# Patient Record
Sex: Male | Born: 1995 | Race: Black or African American | Hispanic: No | Marital: Single | Smoking: Never smoker
Health system: Southern US, Community
[De-identification: ages and names within clinical notes are randomized; demographics above are authoritative.]

---

## 2020-10-26 ENCOUNTER — Encounter (HOSPITAL_COMMUNITY): Payer: Self-pay

## 2020-10-26 ENCOUNTER — Other Ambulatory Visit: Payer: Self-pay

## 2020-10-26 ENCOUNTER — Ambulatory Visit (HOSPITAL_COMMUNITY)
Admission: EM | Admit: 2020-10-26 | Discharge: 2020-10-26 | Disposition: A | Discharge status: Home / Self Care | Attending: Family Medicine | Admitting: Family Medicine

## 2020-10-26 DIAGNOSIS — J069 Acute upper respiratory infection, unspecified: Secondary | ICD-10-CM | POA: Diagnosis not present

## 2020-10-26 DIAGNOSIS — U071 COVID-19: Secondary | ICD-10-CM | POA: Diagnosis not present

## 2020-10-26 DIAGNOSIS — R509 Fever, unspecified: Secondary | ICD-10-CM | POA: Diagnosis present

## 2020-10-26 MED ORDER — PROMETHAZINE-DM 6.25-15 MG/5ML PO SYRP: 5.0000 mL | ORAL_SOLUTION | Freq: Four times a day (QID) | ORAL | 0 refills | Status: AC | PRN

## 2020-10-26 MED ORDER — ACETAMINOPHEN 325 MG PO TABS
650.0000 mg | ORAL_TABLET | Freq: Once | ORAL | Status: AC
  Administered 2020-10-26: 650 mg via ORAL

## 2020-10-26 MED ORDER — ACETAMINOPHEN 325 MG PO TABS
ORAL_TABLET | ORAL | Status: AC
  Filled 2020-10-26: qty 2

## 2020-10-26 MED ORDER — LIDOCAINE VISCOUS HCL 2 % MT SOLN: 20.0000 mL | OROMUCOSAL | 0 refills | Status: AC | PRN

## 2020-10-26 NOTE — ED Triage Notes (Signed)
Pt presents with fever, chills, sore throat, cough, body aches and nasal congestion x 5 days. NyQuil gives relief, last dose last night.

## 2020-10-26 NOTE — ED Provider Notes (Signed)
[facility]    CSN: [id number] Arrival date & time: 10/26/20  1736      History   Chief Complaint Chief Complaint  Patient presents with   Fever   Sore Throat   Generalized Body Aches   Nasal Congestion   Chills   Cough    HPI Alan Cunningham is a 24 y.o. male.   Patient presenting today with 5 day hx of fever, chills, cough, congestion, sore throat, body aches, headache. Denies N/V/D, abdominal pain, rashes, CP, SOB. Took nyquil several times which helped some. Has been around lots of people during homecoming week the past week, unsure if sick contacts. No known chronic medical conditions.      History reviewed. No pertinent past medical history.  There are no problems to display for this patient.   History reviewed. No pertinent surgical history.     Home Medications    Prior to Admission medications   Medication Sig Start Date End Date Taking? Authorizing Provider  lidocaine (XYLOCAINE) 2 % solution Use as directed 20 mLs in the mouth or throat as needed for mouth pain. 10/26/20   Particia Nearing, PA-C  promethazine-dextromethorphan (PROMETHAZINE-DM) 6.25-15 MG/5ML syrup Take 5 mLs by mouth 4 (four) times daily as needed for cough. 10/26/20   Particia Nearing, PA-C    Family History History reviewed. No pertinent family history.  Social History Social History   Tobacco Use   Smoking status: Never Smoker   Smokeless tobacco: Never Used  Substance Use Topics   Alcohol use: Yes   Drug use: Never     Allergies   Patient has no known allergies.   Review of Systems Review of Systems PER HPI   Physical Exam Triage Vital Signs ED Triage Vitals  Enc Vitals Group     BP 10/26/20 1811 105/61     Pulse Rate 10/26/20 1811 (!) 112     Resp 10/26/20 1811 18     Temp 10/26/20 1811 (!) 102.4 F (39.1 C)     Temp Source 10/26/20 1811 Oral     SpO2 --      Weight --      Height --      Head Circumference --      Peak  Flow --      Pain Score 10/26/20 1809 0     Pain Loc --      Pain Edu? --      Excl. in GC? --    No data found.  Updated Vital Signs BP 105/61 (BP Location: Right Arm)    Pulse (!) 112    Temp (!) 102.4 F (39.1 C) (Oral)    Resp 18   Visual Acuity Right Eye Distance:   Left Eye Distance:   Bilateral Distance:    Right Eye Near:   Left Eye Near:    Bilateral Near:     Physical Exam Vitals and nursing note reviewed.  Constitutional:      Appearance: Normal appearance.  HENT:     Head: Atraumatic.     Right Ear: Tympanic membrane normal.     Left Ear: Tympanic membrane normal.     Nose: Rhinorrhea present.     Mouth/Throat:     Mouth: Mucous membranes are moist.     Pharynx: Posterior oropharyngeal erythema (no significant tonsillar edema) present. No oropharyngeal exudate.  Eyes:     Extraocular Movements: Extraocular movements intact.     Conjunctiva/sclera: Conjunctivae normal.  Cardiovascular:  Rate and Rhythm: Regular rhythm. Tachycardia present.     Heart sounds: Normal heart sounds.  Pulmonary:     Effort: Pulmonary effort is normal.     Breath sounds: Normal breath sounds. No wheezing or rales.  Abdominal:     General: Bowel sounds are normal. There is no distension.     Palpations: Abdomen is soft.     Tenderness: There is no abdominal tenderness. There is no guarding.  Musculoskeletal:        General: Normal range of motion.     Cervical back: Normal range of motion and neck supple.  Skin:    General: Skin is warm and dry.  Neurological:     General: No focal deficit present.     Mental Status: He is oriented to person, place, and time.  Psychiatric:        Mood and Affect: Mood normal.        Thought Content: Thought content normal.        Judgment: Judgment normal.     UC Treatments / Results  Labs (all labs ordered are listed, but only abnormal results are displayed) Labs Reviewed  RESP PANEL BY RT PCR (RSV, FLU A&B, COVID)     EKG   Radiology No results found.  Procedures Procedures (including critical care time)  Medications Ordered in UC Medications  acetaminophen (TYLENOL) tablet 650 mg (650 mg Oral Given 10/26/20 1817)    Initial Impression / Assessment and Plan / UC Course  I have reviewed the triage vital signs and the nursing notes.  Pertinent labs & imaging results that were available during my care of the patient were reviewed by me and considered in my medical decision making (see chart for details).     Tylenol given in clinic due to fever of 102.4, suspect his tachycardia coming from this as well. Suspect viral URI, resp panel pending. Letter given while awaiting results, isolation protocol reviewed. Phenergan DM and viscous lidocaine sent for prn symptomatic relief, supportive home care and return precautions discussed at length.   Final Clinical Impressions(s) / UC Diagnoses   Final diagnoses:  Viral URI with cough   Discharge Instructions   None    ED Prescriptions    Medication Sig Dispense Auth. Provider   lidocaine (XYLOCAINE) 2 % solution Use as directed 20 mLs in the mouth or throat as needed for mouth pain. 100 mL Particia Nearing, [place]   promethazine-dextromethorphan (PROMETHAZINE-DM) 6.25-15 MG/5ML syrup Take 5 mLs by mouth 4 (four) times daily as needed for cough. 100 mL Particia Nearing, [place]     PDMP not reviewed this encounter.   Particia Nearing, [place] 10/26/20 1831

## 2020-10-27 LAB — RESP PANEL BY RT PCR (RSV, FLU A&B, COVID)
Influenza A by PCR: NEGATIVE
Influenza B by PCR: NEGATIVE
Respiratory Syncytial Virus by PCR: NEGATIVE
SARS Coronavirus 2 by RT PCR: POSITIVE — AB

## 2020-10-28 ENCOUNTER — Telehealth: Payer: Self-pay | Admitting: Nurse Practitioner

## 2020-10-28 ENCOUNTER — Telehealth: Payer: Self-pay | Admitting: Adult Health

## 2020-10-28 NOTE — Telephone Encounter (Signed)
Called to Discuss with patient about Covid symptoms and the use of the monoclonal antibody infusion for those with mild to moderate Covid symptoms and at a high risk of hospitalization.     Pt appears to qualify for this infusion due to a member of an at-risk group in accordance with the FDA Emergency Use Authorization.    Unable to reach pt. No My Chart. Voicemail message left after second attempt. First attempt someone answered when asked for patient and identified self they hung up.   Willette Alma, NP [facility]  [phone]

## 2020-10-28 NOTE — Telephone Encounter (Signed)
I called and talked with Alan Cunningham about COVID 19 and treatment with monoclonal antibody therapy.  He is going to think about it and will call us back if interested.    Lillard Anes, NP

## 2021-01-19 ENCOUNTER — Emergency Department (HOSPITAL_COMMUNITY)

## 2021-01-19 ENCOUNTER — Emergency Department (HOSPITAL_COMMUNITY)
Admission: EM | Admit: 2021-01-19 | Discharge: 2021-01-19 | Disposition: A | Discharge status: Home / Self Care | Attending: Emergency Medicine | Admitting: Emergency Medicine

## 2021-01-19 ENCOUNTER — Other Ambulatory Visit: Payer: Self-pay

## 2021-01-19 DIAGNOSIS — W500XXA Accidental hit or strike by another person, initial encounter: Secondary | ICD-10-CM | POA: Insufficient documentation

## 2021-01-19 DIAGNOSIS — R52 Pain, unspecified: Secondary | ICD-10-CM

## 2021-01-19 DIAGNOSIS — S43004A Unspecified dislocation of right shoulder joint, initial encounter: Secondary | ICD-10-CM | POA: Diagnosis not present

## 2021-01-19 DIAGNOSIS — S4991XA Unspecified injury of right shoulder and upper arm, initial encounter: Secondary | ICD-10-CM | POA: Diagnosis present

## 2021-01-19 MED ORDER — OXYCODONE-ACETAMINOPHEN 5-325 MG PO TABS
1.0000 | ORAL_TABLET | ORAL | Status: DC | PRN
  Administered 2021-01-19: 1 via ORAL
  Filled 2021-01-19: qty 1

## 2021-01-19 MED ORDER — PROPOFOL 10 MG/ML IV BOLUS
37.5000 mg | Freq: Once | INTRAVENOUS | Status: AC
  Administered 2021-01-19: 37.5 mg via INTRAVENOUS
  Filled 2021-01-19: qty 20

## 2021-01-19 MED ORDER — IBUPROFEN 800 MG PO TABS: 800.0000 mg | ORAL_TABLET | Freq: Three times a day (TID) | ORAL | 0 refills | Status: DC

## 2021-01-19 NOTE — ED Provider Notes (Signed)
.  Sedation  Date/Time: 01/19/2021 2:30 AM Performed by: Zadie Rhine, MD Authorized by: Zadie Rhine, MD   Consent:    Consent obtained:  Written   Consent given by:  Patient   Risks discussed:  Vomiting Universal protocol:    Immediately prior to procedure, a time out was called: yes     Patient identity confirmed:  Arm band and provided demographic data Indications:    Procedure performed:  Dislocation reduction   Procedure necessitating sedation performed by:  Physician performing sedation Pre-sedation assessment:    Time since last food or drink:  4   ASA classification: class 1 - normal, healthy patient     Mallampati score:  I - soft palate, uvula, fauces, pillars visible   Neck mobility: normal     Pre-sedation assessments completed and reviewed: airway patency and mental status     Pre-sedation assessment completed:  01/19/2021 2:31 AM Immediate pre-procedure details:    Reassessment: Patient reassessed immediately prior to procedure     Reviewed: vital signs     Verified: bag valve mask available, emergency equipment available, IV patency confirmed, oxygen available and suction available   Procedure details (see MAR for exact dosages):    Preoxygenation:  Nasal cannula   Sedation:  Propofol   Intended level of sedation: deep   Intra-procedure monitoring:  Blood pressure monitoring, cardiac monitor, continuous capnometry, continuous pulse oximetry and frequent vital sign checks   Intra-procedure events: none     Total Provider sedation time (minutes):  16 Post-procedure details:    Post-sedation assessment completed:  01/19/2021 3:26 AM   Attendance: Constant attendance by certified staff until patient recovered     Recovery: Patient returned to pre-procedure baseline     Post-sedation assessments completed and reviewed: mental status and respiratory function     Patient is stable for discharge or admission: yes     Procedure completion:  Tolerated well, no  immediate complications      Zadie Rhine, MD 01/19/21 [phone]

## 2021-01-19 NOTE — Discharge Instructions (Addendum)
You should continue to use the sling until you are evaluated by orthopedics.  Use ice and ibuprofen for pain.

## 2021-01-19 NOTE — ED Notes (Addendum)
Pt given fluids. No complaints of nausea.

## 2021-01-19 NOTE — Progress Notes (Signed)
Orthopedic Tech Progress Note Patient Details:  Alan Cunningham 04/30/1996 [id number]  Ortho Devices Type of Ortho Device: Sling immobilizer Ortho Device/Splint Location: rue. applied post reduction. Ortho Device/Splint Interventions: Ordered,Application,Adjustment   Post Interventions Patient Tolerated: Well Instructions Provided: Care of device,Adjustment of device   [name] 01/19/2021, 3:37 AM

## 2021-01-19 NOTE — ED Notes (Signed)
Consent at bedside.  
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
## 2021-01-22 ENCOUNTER — Ambulatory Visit (INDEPENDENT_AMBULATORY_CARE_PROVIDER_SITE_OTHER): Admitting: Physician Assistant

## 2021-01-22 ENCOUNTER — Ambulatory Visit: Admitting: Physician Assistant

## 2021-01-22 ENCOUNTER — Encounter: Payer: Self-pay | Admitting: Physician Assistant

## 2021-01-22 DIAGNOSIS — S43004S Unspecified dislocation of right shoulder joint, sequela: Secondary | ICD-10-CM
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
## 2021-02-17 ENCOUNTER — Ambulatory Visit: Admitting: Physician Assistant

## 2021-02-19 ENCOUNTER — Ambulatory Visit: Admitting: Physician Assistant

## 2021-02-24 ENCOUNTER — Ambulatory Visit: Admitting: Physician Assistant

## 2021-03-06 ENCOUNTER — Other Ambulatory Visit: Payer: Self-pay

## 2021-03-06 ENCOUNTER — Ambulatory Visit (INDEPENDENT_AMBULATORY_CARE_PROVIDER_SITE_OTHER): Admitting: Physician Assistant

## 2021-03-06 ENCOUNTER — Encounter: Payer: Self-pay | Admitting: Physician Assistant

## 2021-03-06 DIAGNOSIS — S43004S Unspecified dislocation of right shoulder joint, sequela: Secondary | ICD-10-CM

## 2021-03-06 NOTE — Progress Notes (Signed)
HPI: Alan Cunningham returns today for follow-up of his right shoulder dislocation.  He is now about 6 weeks status post injury.  He states he has had no additional dislocations of his shoulder.  About a week ago he started doing some push-ups.  He has some discomfort in the shoulder but no significant pain.  He would like to get back to working out with heavy weights.  Physical exam: Bilateral shoulders 5 out of 5 strengths against resistance.  He has negative empty can test bilaterally.  Apprehension test is negative on the right.  Passiv rotation of the right shoulder reveals some slight crepitus.  He has full active range of motion of the shoulder without pain today.   Impression: Status post right shoulder anterior dislocation 6 weeks  Plan: Recommend he go back to working out with just light weights.  Advised him to do more reps then had a weight for the next 6 weeks.  3 months status post injury and get back to full activities as tolerated.  If he has any additional dislocations he will follow-up with [place] or any questions or concerns.  Questions were encouraged and answered at length.

## 2021-05-08 ENCOUNTER — Encounter (HOSPITAL_COMMUNITY): Payer: Self-pay

## 2021-05-08 ENCOUNTER — Ambulatory Visit (HOSPITAL_COMMUNITY)
Admission: EM | Admit: 2021-05-08 | Discharge: 2021-05-08 | Disposition: A | Discharge status: Home / Self Care | Attending: Emergency Medicine | Admitting: Emergency Medicine

## 2021-05-08 DIAGNOSIS — R300 Dysuria: Secondary | ICD-10-CM | POA: Insufficient documentation

## 2021-05-08 DIAGNOSIS — R369 Urethral discharge, unspecified: Secondary | ICD-10-CM | POA: Diagnosis present

## 2021-05-08 LAB — POCT URINALYSIS DIPSTICK, ED / UC
Bilirubin Urine: NEGATIVE
Glucose, UA: NEGATIVE mg/dL
Hgb urine dipstick: NEGATIVE
Leukocytes,Ua: NEGATIVE
Nitrite: NEGATIVE
Protein, ur: 100 mg/dL — AB
Specific Gravity, Urine: 1.025 (ref 1.005–1.030)
Urobilinogen, UA: 0.2 mg/dL (ref 0.0–1.0)
pH: 7 (ref 5.0–8.0)

## 2021-05-08 NOTE — ED Provider Notes (Signed)
[facility]    CSN: [id number] Arrival date & time: 05/08/21  1723      History   Chief Complaint Chief Complaint  Patient presents with  . Penile Discharge  . pain during urination    HPI Alan Cunningham is a 25 y.o. male.   Patient here for evaluation of possible UTI.  Reports having some dysuria and frequency as well as a slight discharge.  Denies any concern over STIs as he reports that he is monogamous and girlfriend recently tested negative.  Denies any trauma, injury, or other precipitating event.  Denies any specific alleviating or aggravating factors.  Denies any fevers, chest pain, shortness of breath, N/V/D, numbness, tingling, weakness, abdominal pain, or headaches.     The history is provided by the patient.  Penile Discharge    History reviewed. No pertinent past medical history.  There are no problems to display for this patient.   History reviewed. No pertinent surgical history.     Home Medications    Prior to Admission medications   Medication Sig Start Date End Date Taking? Authorizing Provider  ibuprofen (ADVIL) 800 MG tablet Take 1 tablet (800 mg total) by mouth 3 (three) times daily. 01/19/21   Roxy Horseman, PA-C  lidocaine (XYLOCAINE) 2 % solution Use as directed 20 mLs in the mouth or throat as needed for mouth pain. 10/26/20   Particia Nearing, PA-C  promethazine-dextromethorphan (PROMETHAZINE-DM) 6.25-15 MG/5ML syrup Take 5 mLs by mouth 4 (four) times daily as needed for cough. 10/26/20   Particia Nearing, PA-C    Family History History reviewed. No pertinent family history.  Social History Social History   Tobacco Use  . Smoking status: Never Smoker  . Smokeless tobacco: Never Used  Substance Use Topics  . Alcohol use: Yes  . Drug use: Never     Allergies   Patient has no known allergies.   Review of Systems Review of Systems  Genitourinary: Positive for dysuria, frequency and penile discharge.  All  other systems reviewed and are negative.    Physical Exam Triage Vital Signs ED Triage Vitals  Enc Vitals Group     BP 05/08/21 1856 106/65     Pulse Rate 05/08/21 1856 75     Resp 05/08/21 1856 18     Temp 05/08/21 1856 98.9 F (37.2 C)     Temp Source 05/08/21 1856 Oral     SpO2 05/08/21 1856 98 %     Weight --      Height --      Head Circumference --      Peak Flow --      Pain Score 05/08/21 1855 0     Pain Loc --      Pain Edu? --      Excl. in GC? --    No data found.  Updated Vital Signs BP 106/65 (BP Location: Right Arm)   Pulse 75   Temp 98.9 F (37.2 C) (Oral)   Resp 18   SpO2 98%   Visual Acuity Right Eye Distance:   Left Eye Distance:   Bilateral Distance:    Right Eye Near:   Left Eye Near:    Bilateral Near:     Physical Exam Vitals and nursing note reviewed.  Constitutional:      General: He is not in acute distress.    Appearance: Normal appearance. He is not ill-appearing, toxic-appearing or diaphoretic.  HENT:     Head: Normocephalic and  atraumatic.  Eyes:     Conjunctiva/sclera: Conjunctivae normal.  Cardiovascular:     Rate and Rhythm: Normal rate.     Pulses: Normal pulses.  Pulmonary:     Effort: Pulmonary effort is normal.  Abdominal:     General: Abdomen is flat.  Genitourinary:    Comments: declines Musculoskeletal:        General: Normal range of motion.     Cervical back: Normal range of motion.  Skin:    General: Skin is warm and dry.  Neurological:     General: No focal deficit present.     Mental Status: He is alert and oriented to person, place, and time.  Psychiatric:        Mood and Affect: Mood normal.      UC Treatments / Results  Labs (all labs ordered are listed, but only abnormal results are displayed) Labs Reviewed  POCT URINALYSIS DIPSTICK, ED / UC - Abnormal; Notable for the following components:      Result Value   Ketones, ur TRACE (*)    Protein, ur 100 (*)    All other components within  normal limits  URINE CULTURE    EKG   Radiology No results found.  Procedures Procedures (including critical care time)  Medications Ordered in UC Medications - No data to display  Initial Impression / Assessment and Plan / UC Course  I have reviewed the triage vital signs and the nursing notes.  Pertinent labs & imaging results that were available during my care of the patient were reviewed by me and considered in my medical decision making (see chart for details).     Assessment negative for red flags or concerns.  Urinalysis positive for ketones and protein but otherwise no signs of infection.  Urine culture pending.  Patient refused STI screening.  Encourage fluids and rest.  Patient may take AZO cranberry or cranberry juice for symptom management.  Encourage patient to follow-up for any worsening symptoms or lack of symptom resolution. Final Clinical Impressions(s) / UC Diagnoses   Final diagnoses:  Dysuria  Penile discharge     Discharge Instructions     We will contact you if the results of your urine culture are positive.   Drink plenty of fluids, especially water.  You can take AZO cranberry or try cranberry juice for symptom management.    If your symptoms do not resolve, return for re-evaluation.     ED Prescriptions    None     PDMP not reviewed this encounter.   Ivette Loyal, NP 05/08/21 2100

## 2021-05-08 NOTE — Discharge Instructions (Signed)
We will contact you if the results of your urine culture are positive.   Drink plenty of fluids, especially water.  You can take AZO cranberry or try cranberry juice for symptom management.    If your symptoms do not resolve, return for re-evaluation.

## 2021-05-08 NOTE — ED Triage Notes (Signed)
Pt in with c/o pain during urination and penile discharge x 2 weeks

## 2021-05-10 LAB — URINE CULTURE: Culture: NO GROWTH

## 2022-03-25 IMAGING — CR DG SHOULDER 2+V*R*
2 series · 2 of 2 positions shown · non-contrast
Comparison: None.

CLINICAL DATA: Shoulder dislocation

EXAM:
RIGHT SHOULDER - 2+ VIEW

[shoulder y view]
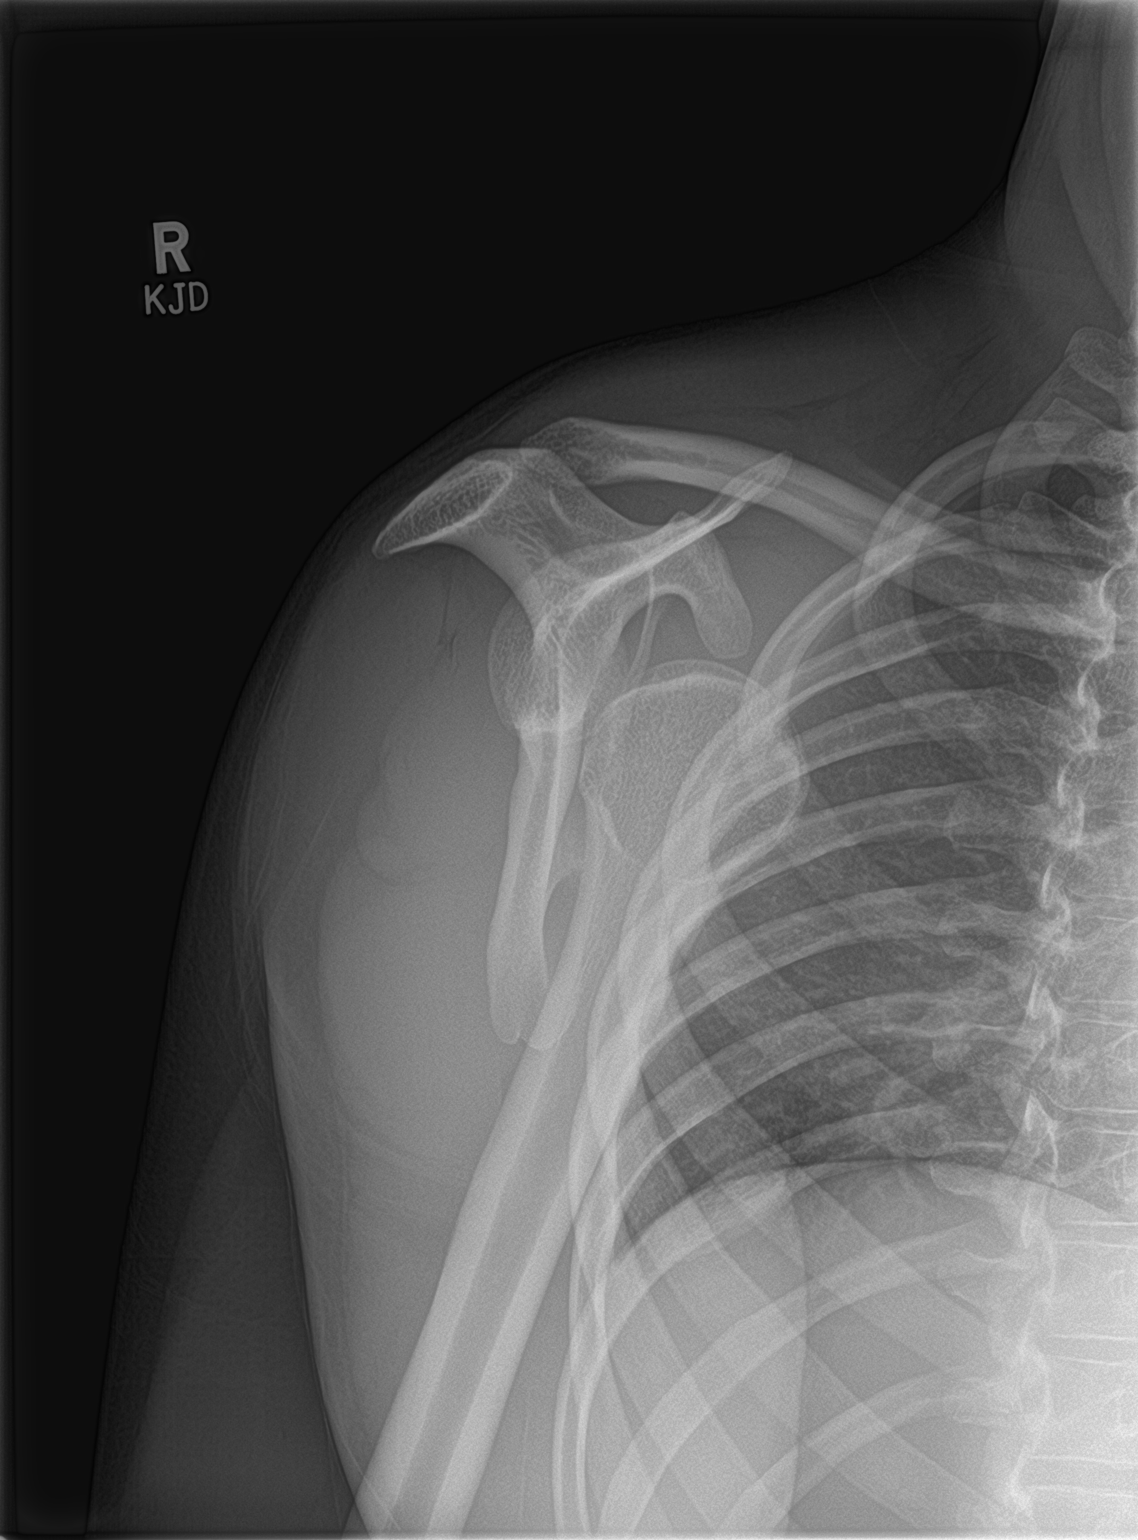

[shoulder ap neutral]
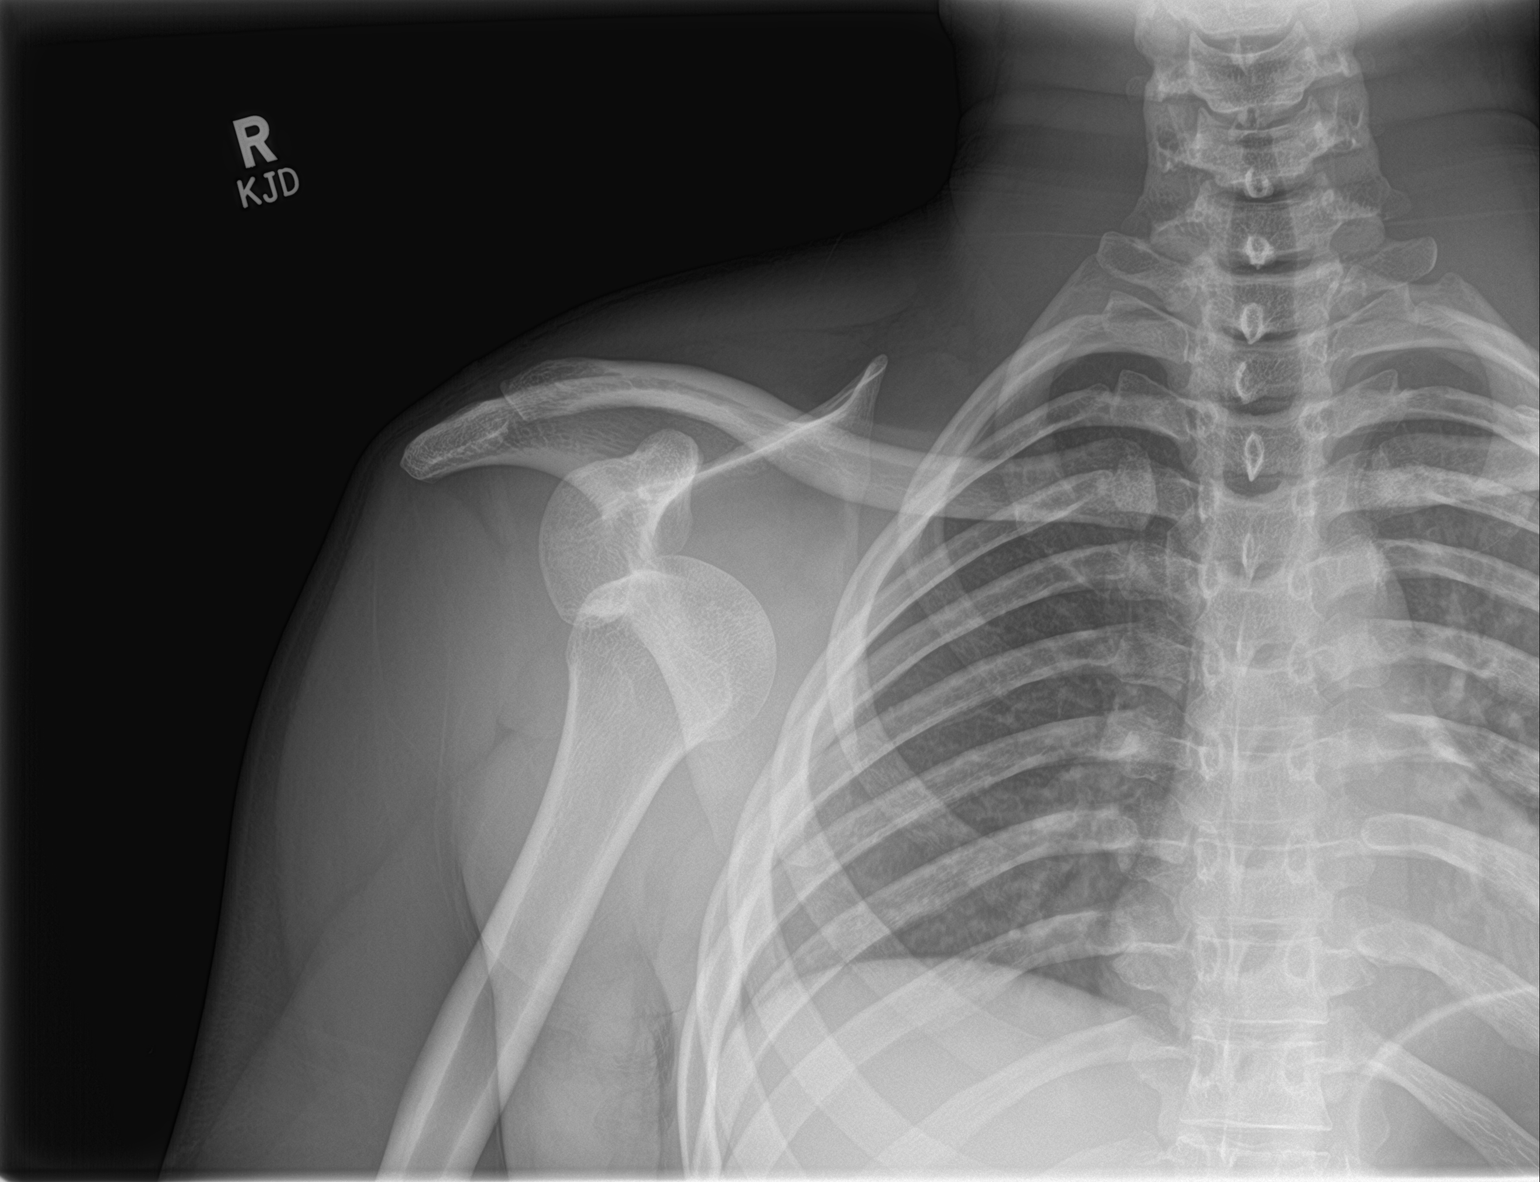

[2 of 2 positions shown; findings below may reference images not displayed]

FINDINGS: Anterior inferior dislocation of the right humeral head is noted. No
acute fracture is seen. Underlying bony thorax is within normal
limits.
IMPRESSION: Anterior inferior dislocation of the right humeral head

## 2023-11-21 ENCOUNTER — Encounter (HOSPITAL_COMMUNITY): Payer: Self-pay

## 2023-11-21 ENCOUNTER — Ambulatory Visit (HOSPITAL_COMMUNITY): Admission: EM | Admit: 2023-11-21 | Discharge: 2023-11-21 | Disposition: A | Discharge status: Home / Self Care

## 2023-11-21 ENCOUNTER — Other Ambulatory Visit: Payer: Self-pay

## 2023-11-21 ENCOUNTER — Encounter (HOSPITAL_COMMUNITY): Payer: Self-pay | Admitting: *Deleted

## 2023-11-21 ENCOUNTER — Emergency Department (HOSPITAL_COMMUNITY): Payer: BC Managed Care – PPO

## 2023-11-21 ENCOUNTER — Emergency Department (HOSPITAL_COMMUNITY)
Admission: EM | Admit: 2023-11-21 | Discharge: 2023-11-21 | Disposition: A | Discharge status: Home / Self Care | Payer: BC Managed Care – PPO

## 2023-11-21 DIAGNOSIS — S43014A Anterior dislocation of right humerus, initial encounter: Secondary | ICD-10-CM | POA: Insufficient documentation

## 2023-11-21 DIAGNOSIS — X500XXA Overexertion from strenuous movement or load, initial encounter: Secondary | ICD-10-CM | POA: Insufficient documentation

## 2023-11-21 DIAGNOSIS — Y990 Civilian activity done for income or pay: Secondary | ICD-10-CM | POA: Insufficient documentation

## 2023-11-21 DIAGNOSIS — S4991XA Unspecified injury of right shoulder and upper arm, initial encounter: Secondary | ICD-10-CM | POA: Diagnosis present

## 2023-11-21 DIAGNOSIS — S43004A Unspecified dislocation of right shoulder joint, initial encounter: Secondary | ICD-10-CM

## 2023-11-21 MED ORDER — PROPOFOL 10 MG/ML IV BOLUS
0.5000 mg/kg | Freq: Once | INTRAVENOUS | Status: DC
  Filled 2023-11-21: qty 20

## 2023-11-21 MED ORDER — ONDANSETRON HCL 4 MG/2ML IJ SOLN
4.0000 mg | Freq: Once | INTRAMUSCULAR | Status: AC
  Administered 2023-11-21: 4 mg via INTRAVENOUS
  Filled 2023-11-21: qty 2

## 2023-11-21 MED ORDER — SODIUM CHLORIDE 0.9 % IV BOLUS
1000.0000 mL | Freq: Once | INTRAVENOUS | Status: AC
  Administered 2023-11-21: 1000 mL via INTRAVENOUS

## 2023-11-21 MED ORDER — KETOROLAC TROMETHAMINE 10 MG PO TABS: 10.0000 mg | ORAL_TABLET | Freq: Four times a day (QID) | ORAL | 0 refills | Status: AC | PRN

## 2023-11-21 MED ORDER — KETOROLAC TROMETHAMINE 15 MG/ML IJ SOLN
15.0000 mg | Freq: Once | INTRAMUSCULAR | Status: AC
  Administered 2023-11-21: 15 mg via INTRAVENOUS
  Filled 2023-11-21: qty 1

## 2023-11-21 MED ORDER — PROPOFOL 10 MG/ML IV BOLUS
INTRAVENOUS | Status: AC | PRN
  Administered 2023-11-21: 3.8 mg via INTRAVENOUS
  Administered 2023-11-21: 20 mg via INTRAVENOUS

## 2023-11-21 NOTE — Discharge Instructions (Addendum)
Please keep your arm in the shoulder sling.  Take the ketorolac as needed for pain.  Follow-up with the orthopedic office at the number provided.  Return to the ER for worsening symptoms.

## 2023-11-21 NOTE — ED Triage Notes (Signed)
The pt has a dislocated rt shoulder he did it  2 hours ago at work  his shoulder struck  an object and the pt popped out of place  the shoulder has been out one other time

## 2023-11-21 NOTE — ED Provider Notes (Signed)
Patient seen briefly in triage.  Today he was pushing on something and his right shoulder popped out of place.  He has had it dislocate before  He cannot abduct his right upper arm and he is in a lot of pain.  I have asked him to proceed to the emergency room for further evaluation and treatment that we cannot provide here in urgent care setting   Zenia Resides, MD 11/21/23 1737

## 2023-11-21 NOTE — ED Triage Notes (Signed)
Presents with right shoulder pain after pulling heavy equipment today at work. Pain 10/10

## 2023-11-21 NOTE — ED Provider Notes (Signed)
[place] EMERGENCY DEPARTMENT AT [facility] Provider Note   CSN: [id number] Arrival date & time: 11/21/23  1744     History  Chief Complaint  Patient presents with   Shoulder Injury    Alan Cunningham is a 27 y.o. male.  27 year old male with past medical history of shoulder dislocation in the past and no other medical issues presenting to the emergency department today with pain in his right shoulder.  The patient states that he was pushing some heavy equipment at work and he felt his right shoulder pop.  This occurred 3 hours prior to arrival.  He denies any other injuries.  He denies any focal weakness, numbness, or tingling.  States he is having pain in the right shoulder.  This is similar to what he experienced when his shoulder dislocated few years ago.  He denies any issues with anesthesia in the past.   Shoulder Injury       Home Medications Prior to Admission medications   Medication Sig Start Date End Date Taking? Authorizing Provider  ketorolac (TORADOL) 10 MG tablet Take 1 tablet (10 mg total) by mouth every 6 (six) hours as needed. 11/21/23  Yes Durwin Glaze, MD  lidocaine (XYLOCAINE) 2 % solution Use as directed 20 mLs in the mouth or throat as needed for mouth pain. 10/26/20   Particia Nearing, PA-C  promethazine-dextromethorphan (PROMETHAZINE-DM) 6.25-15 MG/5ML syrup Take 5 mLs by mouth 4 (four) times daily as needed for cough. 10/26/20   Particia Nearing, PA-C      Allergies    Patient has no known allergies.    Review of Systems   Review of Systems  Musculoskeletal:        Right shoulder pain  All other systems reviewed and are negative.   Physical Exam Updated Vital Signs BP 99/62   Pulse (!) 55   Temp 98.6 F (37 C) (Oral)   Resp 16   Ht 5\' 8"  (1.727 m)   Wt 77.1 kg   SpO2 97%   BMI 25.85 kg/m  Physical Exam Vitals and nursing note reviewed.   Gen: Appears uncomfortable Eyes: PERRL, EOMI HEENT: no oropharyngeal  swelling Neck: trachea midline Resp: clear to auscultation bilaterally Card: RRR, no murmurs, rubs, or gallops Abd: nontender, nondistended Extremities: Obvious deformity to right shoulder noted Neuro: Patient has normal grip strength as well as normal strength with wrist flexion extension as well as elbow flexion and extension, he will not move the right shoulder due to pain.  Reports normal sensation over the deltoid. Vascular: 2+ radial pulses bilaterally Skin: no rashes Psyc: acting appropriately   ED Results / Procedures / Treatments   Labs (all labs ordered are listed, but only abnormal results are displayed) Labs Reviewed - No data to display  EKG None  Radiology DG Shoulder Right  Result Date: 11/21/2023 CLINICAL DATA:  Status post reduction EXAM: RIGHT SHOULDER - 3 VIEW COMPARISON:  Same day right shoulder radiographs FINDINGS: Interval reduction of right shoulder dislocation. Anatomic alignment of the right shoulder. Hill-Sachs deformity of the lateral humeral head. IMPRESSION: Interval reduction of right shoulder dislocation. Electronically Signed   By: Agustin Cree M.D.   On: 11/21/2023 19:52   DG Shoulder Right  Result Date: 11/21/2023 CLINICAL DATA:  Dislocation of right shoulder. EXAM: RIGHT SHOULDER - 2+ VIEW COMPARISON:  January 19, 2021. FINDINGS: Anterior dislocation of right glenohumeral joint is noted. No definite fracture is noted. IMPRESSION: Anterior dislocation of right glenohumeral joint  is noted. Electronically Signed   By: Lupita Raider M.D.   On: 11/21/2023 18:59    Procedures .Sedation  Date/Time: 11/21/2023 7:59 PM  Performed by: Durwin Glaze, MD Authorized by: Durwin Glaze, MD   Consent:    Consent obtained:  Written   Consent given by:  Patient   Risks discussed:  Allergic reaction, prolonged hypoxia resulting in organ damage, dysrhythmia, prolonged sedation necessitating reversal, inadequate sedation, respiratory compromise necessitating  ventilatory assistance and intubation, nausea and vomiting   Alternatives discussed:  Analgesia without sedation and anxiolysis Universal protocol:    Procedure explained and questions answered to patient or proxy's satisfaction: yes     Relevant documents present and verified: yes     Test results available: yes     Imaging studies available: yes     Required blood products, implants, devices, and special equipment available: yes     Site/side marked: yes     Immediately prior to procedure, a time out was called: yes   Indications:    Procedure performed:  Dislocation reduction   Procedure necessitating sedation performed by:  Physician performing sedation Pre-sedation assessment:    Time since last food or drink:  1600   NPO status caution: urgency dictates proceeding with non-ideal NPO status     ASA classification: class 1 - normal, healthy patient     Mouth opening:  3 or more finger widths   Thyromental distance:  4 finger widths   Mallampati score:  I - soft palate, uvula, fauces, pillars visible   Neck mobility: normal     Pre-sedation assessments completed and reviewed: pre-procedure airway patency not reviewed, pre-procedure cardiovascular function not reviewed, pre-procedure hydration status not reviewed, pre-procedure mental status not reviewed, pre-procedure nausea and vomiting status not reviewed, pre-procedure pain level not reviewed, pre-procedure respiratory function not reviewed and pre-procedure temperature not reviewed   A pre-sedation assessment was completed prior to the start of the procedure Immediate pre-procedure details:    Reassessment: Patient reassessed immediately prior to procedure     Reviewed: vital signs, relevant labs/tests and NPO status     Verified: bag valve mask available, emergency equipment available, intubation equipment available, IV patency confirmed, oxygen available and suction available   Procedure details (see MAR for exact dosages):     Preoxygenation:  Nasal cannula   Sedation:  Propofol   Intended level of sedation: moderate (conscious sedation)   Analgesia: Toradol.   Intra-procedure monitoring:  Blood pressure monitoring, continuous capnometry, frequent LOC assessments, frequent vital sign checks, continuous pulse oximetry and cardiac monitor   Intra-procedure events: hypotension     Intra-procedure management:  Fluid bolus   Total Provider sedation time (minutes):  12 Post-procedure details:   A post-sedation assessment was completed following the completion of the procedure.   Attendance: Constant attendance by certified staff until patient recovered     Recovery: Patient returned to pre-procedure baseline     Post-sedation assessments completed and reviewed: post-procedure airway patency not reviewed, post-procedure cardiovascular function not reviewed, post-procedure hydration status not reviewed, post-procedure mental status not reviewed, post-procedure nausea and vomiting status not reviewed, pain score not reviewed, post-procedure respiratory function not reviewed and post-procedure temperature not reviewed     Patient is stable for discharge or admission: yes     Procedure completion:  Tolerated well, no immediate complications Comments:     Brief episode of hypotension for less than 1 minute that responded to IV fluid bolus .Reduction of dislocation  Date/Time: 11/21/2023 8:02 PM  Performed by: Durwin Glaze, MD Authorized by: Durwin Glaze, MD  Consent: Verbal consent obtained. Written consent obtained. Risks and benefits: risks, benefits and alternatives were discussed Consent given by: patient Patient understanding: patient states understanding of the procedure being performed Patient consent: the patient's understanding of the procedure matches consent given Procedure consent: procedure consent matches procedure scheduled Relevant documents: relevant documents present and verified Test results: test  results available and properly labeled Site marked: the operative site was marked Imaging studies: imaging studies available Patient identity confirmed: verbally with patient Time out: Immediately prior to procedure a "time out" was called to verify the correct patient, procedure, equipment, support staff and site/side marked as required. Local anesthesia used: no  Anesthesia: Local anesthesia used: no  Sedation: Patient sedated: yes Sedation type: moderate (conscious) sedation Sedatives: propofol Analgesia: see MAR for details Vitals: Vital signs were monitored during sedation.  Patient tolerance: patient tolerated the procedure well with no immediate complications       Medications Ordered in ED Medications  propofol (DIPRIVAN) 10 mg/mL bolus/IV push 38.6 mg (0 mg Intravenous Hold 11/21/23 1924)  ketorolac (TORADOL) 15 MG/ML injection 15 mg (15 mg Intravenous Given 11/21/23 1858)  sodium chloride 0.9 % bolus 1,000 mL (1,000 mLs Intravenous New Bag/Given 11/21/23 1900)  ondansetron (ZOFRAN) injection 4 mg (4 mg Intravenous Given 11/21/23 1858)  propofol (DIPRIVAN) 10 mg/mL bolus/IV push (20 mg Intravenous Given 11/21/23 1912)    ED Course/ Medical Decision Making/ A&P                                 Medical Decision Making 27 year old male with no reported past medical history presenting to the emergency department today with right shoulder pain.  This does appear to be dislocated.  I did attempt the [facility] technique on initial evaluation.  This is unsuccessful.  This.  The patient did require sedation with propofol when his shoulder dislocated initially.  Will proceed with this today.  The patient is consented and does agree with this plan.  Will obtain an x-ray to evaluate for fracture although suspicion for this relatively low at this time.  Will proceed with sedation and reduction after x-ray imaging is performed.  The patient's postreduction x-ray does show adequate  reduction.  On reassessment the patient is neurovascular intact.  He is discharged with return precautions.  Amount and/or Complexity of Data Reviewed Radiology: ordered.  Risk Prescription drug management.           Final Clinical Impression(s) / ED Diagnoses Final diagnoses:  Dislocation of right shoulder joint, initial encounter    Rx / DC Orders ED Discharge Orders          Ordered    ketorolac (TORADOL) 10 MG tablet  Every 6 hours PRN        11/21/23 1957              Durwin Glaze, MD 11/21/23 2004

## 2023-11-25 ENCOUNTER — Encounter: Payer: Self-pay | Admitting: Physician Assistant

## 2023-11-25 ENCOUNTER — Ambulatory Visit: Payer: BC Managed Care – PPO | Admitting: Physician Assistant

## 2023-11-25 DIAGNOSIS — M24411 Recurrent dislocation, right shoulder: Secondary | ICD-10-CM | POA: Diagnosis not present

## 2023-11-25 DIAGNOSIS — S43304S Dislocation of unspecified parts of right shoulder girdle, sequela: Secondary | ICD-10-CM | POA: Diagnosis not present

## 2023-11-25 NOTE — Progress Notes (Signed)
   Office Visit Note   Patient: Alan Cunningham           Date of Birth: June 04, 1996           MRN: [id number] Visit Date: 11/25/2023              Requested by: No referring provider defined for this encounter. PCP: Pcp, No   Assessment & Plan: Visit Diagnoses:  1. Closed dislocation of right shoulder girdle, sequela     Plan: 27 year old gentleman who is now 4 days status post right anterior shoulder dislocation.  He was working at [employer] and pushing and stocking some items when his shoulder dislocated.  He was seen and evaluated in the emergency room could not reduce the shoulder himself was reduced by the ER, with sedation.  He does have a history of an anterior dislocation 2 years ago which again was reduced by the emergency department though he did not think he had to have any sedation.  He does admit that he has had some uncomfortable feelings in the shoulder and over the past couple years.  Given this is his second dislocation I think an MRI arthrogram is in order to evaluate the labrum.  Can then follow-up with Dr. August Saucer.  In the meantime is to remain in the immobilizer may come out with his elbow at his side to stretch his elbow out and work on grip strength.  Follow-Up Instructions: With Dr. August Saucer  Orders:  Orders Placed This Encounter  Procedures   MR SHOULDER RIGHT W CONTRAST   Arthrogram   No orders of the defined types were placed in this encounter.     Procedures: No procedures performed   Clinical Data: No additional findings.   Subjective: Chief Complaint  Patient presents with   Right Shoulder - Dislocation, Follow-up    HPI examination of his right shoulder no sensation deficits he has excellent grip strength strong radial pulse has good axial nerve strength compartments are soft mild to moderate soft tissue swelling but no clinical deformity  Review of Systems  All other systems reviewed and are negative.    Objective: Vital Signs: There were no vitals  taken for this visit.  Physical Exam Pulmonary:     Effort: Pulmonary effort is normal.  Musculoskeletal:        General: Normal range of motion.     Cervical back: Normal range of motion.  Skin:    General: Skin is warm and dry.  Neurological:     General: No focal deficit present.     Mental Status: He is alert and oriented to person, place, and time.     Ortho Exam  Specialty Comments:  No specialty comments available.  Imaging: No results found.   PMFS History: There are no problems to display for this patient.  History reviewed. No pertinent past medical history.  History reviewed. No pertinent family history.  History reviewed. No pertinent surgical history. Social History   Occupational History   Not on file  Tobacco Use   Smoking status: Never   Smokeless tobacco: Never  Substance and Sexual Activity   Alcohol use: Yes   Drug use: Never   Sexual activity: Not on file

## 2023-12-06 ENCOUNTER — Ambulatory Visit
Admission: RE | Admit: 2023-12-06 | Discharge: 2023-12-06 | Disposition: A | Discharge status: Home / Self Care | Payer: BC Managed Care – PPO | Source: Ambulatory Visit | Attending: Physician Assistant

## 2023-12-06 ENCOUNTER — Telehealth (HOSPITAL_COMMUNITY): Payer: Self-pay

## 2023-12-06 DIAGNOSIS — S43304S Dislocation of unspecified parts of right shoulder girdle, sequela: Secondary | ICD-10-CM

## 2023-12-06 MED ORDER — IOPAMIDOL (ISOVUE-M 200) INJECTION 41%
10.0000 mL | Freq: Once | INTRAMUSCULAR | Status: AC
  Administered 2023-12-06: 10 mL via INTRA_ARTICULAR

## 2023-12-21 ENCOUNTER — Ambulatory Visit: Payer: BC Managed Care – PPO | Admitting: Surgical

## 2023-12-21 ENCOUNTER — Encounter: Payer: Self-pay | Admitting: Surgical

## 2023-12-21 DIAGNOSIS — S43304A Dislocation of unspecified parts of right shoulder girdle, initial encounter: Secondary | ICD-10-CM

## 2023-12-21 DIAGNOSIS — S43304S Dislocation of unspecified parts of right shoulder girdle, sequela: Secondary | ICD-10-CM

## 2023-12-21 NOTE — Progress Notes (Signed)
 Office Visit Note   Patient: Alan Cunningham           Date of Birth: 1996-11-05           MRN: [id number] Visit Date: 12/21/2023 Requested by: No referring provider defined for this encounter. PCP: Pcp, No  Subjective: Chief Complaint  Patient presents with   Right Shoulder - Follow-up    Review MRI    HPI: Alan Cunningham is a 27 y.o. male who presents to the office for MRI review. Continues to complain mainly of right shoulder discomfort.  Was not really having a lot of discomfort until he had arthrogram injection.  Prior to that he was seen by Alan Cunningham for right shoulder dislocation.  He has a history of 2 shoulder dislocations in his right shoulder.  The first 1 was 2022 which was reduced in the ER without sedation and he was able to rehab this with physical therapy without much difficulty.  Second dislocation happened recently.  He works as a nature conservation officer at [employer] and he was trying to get a heavy box in place which caused him to decide to ram the box with his shoulder causing his shoulder dislocate.  He had the shoulder reduced in the emergency department under sedation.  Since his shoulder dislocation, he has been doing less physical work at [employer] just [employer] mostly with the use of his noninjured arm.  He denies any significant medical history.  Does not smoke.  No history of diabetes.  He does not really do any significant physical activity aside from his job.  In his spare time he enjoys playing video games.  MRI results revealed: MR SHOULDER RIGHT W CONTRAST Result Date: 12/06/2023 CLINICAL DATA:  Recurrent shoulder dislocations. EXAM: MRI OF THE RIGHT SHOULDER WITH CONTRAST TECHNIQUE: Multiplanar, multisequence MR imaging of the right shoulder was performed following the administration of intra-articular contrast. CONTRAST:  See Injection Documentation. COMPARISON:  Radiographs 11/21/2023 FINDINGS: Rotator cuff: The rotator cuff tendons are intact. No  tendinopathy or tear. Muscles: Normal Biceps long head: Intact Acromioclavicular Joint: No degenerative changes. The acromion is type 1-2 in shape. Minimal lateral downsloping but no undersurface spurring. Glenohumeral Joint: The articular cartilage is normal. No obvious loose bodies. Labrum: The anterior labrum is extensively torn and detached from the 2 o'clock to 6 o'clock position. Suspect periosteal sleeve avulsion also. I do not see a definite bony Bankart lesion. It is possible there is a small or thin avulsion fracture. If this would change surgical management a CT scan may be helpful for further evaluation. The superior and posterior labrum are intact. There is also high-grade partial tearing/avulsion of the anterior band of the inferior glenohumeral ligament from its glenoid attachment site. Bones: Hill-Sachs type impaction injury involving the posterior aspect of the humeral head peer no definite bony Bankart lesion. IMPRESSION: 1. Extensive anterior labral tear and probable periosteal sleeve avulsion. No definite bony Bankart lesion. CT may be helpful for further evaluation. 2. High-grade partial tearing/avulsion of the anterior band of the inferior glenohumeral ligament from its glenoid attachment site. 3. Intact rotator cuff tendons. 4. Intact long head biceps tendon. Electronically Signed   By: MYRTIS Stammer M.D.   On: 12/06/2023 15:29                 ROS: All systems reviewed are negative as they relate to the chief complaint within the history of present illness.  Patient denies fevers or chills.  Assessment & Plan:  Visit Diagnoses:  1. Closed dislocation of right shoulder girdle, sequela     Plan: Alan Cunningham is a 27 y.o. male who presents to the office for review of MRI of the right shoulder MRI does demonstrate extensive tearing of the anterior labrum with avulsion of the inferior glenohumeral ligament from the glenoid.  He has notable instability on exam today.  We discussed all of  the findings and what continued instability entails for his shoulder joint long-term.  Recommended surgical intervention in the form of arthroscopic labral repair and potential repair of the glenohumeral ligament based on surgical evaluation.  We discussed the risks and benefits of the procedure including the recovery timeframe.  After lengthy discussion patient is apprehensive about surgery and would like to avoid this.  He wants to try and rehab this with physical therapy to focus on rotator cuff strengthening to try and prevent any further instability episodes.  I will set this up for him but my current recommendation would be to proceed with surgical intervention to prevent any future dislocation episodes.  Patient understands but would like to try therapy first.  He will follow-up in 4 weeks for clinical recheck with Alan Cunningham.  Follow-Up Instructions: No follow-ups on file.   Orders:  No orders of the defined types were placed in this encounter.  No orders of the defined types were placed in this encounter.     Procedures: No procedures performed   Clinical Data: No additional findings.  Objective: Vital Signs: There were no vitals taken for this visit.  Physical Exam:  Constitutional: Patient appears well-developed HEENT:  Head: Normocephalic Eyes:EOM are normal Neck: Normal range of motion Cardiovascular: Normal rate Pulmonary/chest: Effort normal Neurologic: Patient is alert Skin: Skin is warm Psychiatric: Patient has normal mood and affect  Ortho Exam: Ortho exam demonstrates right shoulder with intact EPL, FPL, finger abduction, pronation/supination, bicep, tricep, deltoid.  Axillary nerve is intact with deltoid firing.  Intact sensation over the lateral deltoid.  He has positive apprehension sign with negative relocation sign.  Does have good stability to posterior translation force with 1+ translation bilaterally.  Increased translation of the right shoulder with anterior  directed force with translation rated 2+ compared with 1+ of the left shoulder.  Excellent rotator cuff strength of supra, infra, subscap.  He has full active and passive range of motion of the affected shoulder with about 70 degrees external rotation, 110 degrees abduction, 175 degrees forward elevation passively and actively.  No skin changes noted.  Specialty Comments:  No specialty comments available.  Imaging: No results found.   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  No family history on file.  No past surgical history on file. Social History   Occupational History   Not on file  Tobacco Use   Smoking status: Never   Smokeless tobacco: Never  Substance and Sexual Activity   Alcohol use: Yes   Drug use: Never   Sexual activity: Not on file

## 2023-12-24 ENCOUNTER — Encounter: Payer: Self-pay | Admitting: Surgical

## 2023-12-30 ENCOUNTER — Ambulatory Visit: Payer: BC Managed Care – PPO | Admitting: Physical Therapy

## 2024-01-19 ENCOUNTER — Ambulatory Visit: Payer: BC Managed Care – PPO | Admitting: Orthopedic Surgery

## 2024-03-09 ENCOUNTER — Ambulatory Visit: Payer: BC Managed Care – PPO | Admitting: Physical Therapy
